# Patient Record
Sex: Female | Born: 1955 | Race: Black or African American | Hispanic: No | Marital: Single | Smoking: Never smoker
Health system: Southern US, Community
[De-identification: ages and names within clinical notes are randomized; demographics above are authoritative.]

## PROBLEM LIST (undated history)

## (undated) DIAGNOSIS — G4730 Sleep apnea, unspecified: Secondary | ICD-10-CM

## (undated) DIAGNOSIS — R0602 Shortness of breath: Secondary | ICD-10-CM

## (undated) DIAGNOSIS — I509 Heart failure, unspecified: Secondary | ICD-10-CM

## (undated) DIAGNOSIS — M1990 Unspecified osteoarthritis, unspecified site: Secondary | ICD-10-CM

## (undated) HISTORY — DX: Sleep apnea, unspecified: G47.30

## (undated) HISTORY — DX: Unspecified osteoarthritis, unspecified site: M19.90

## (undated) HISTORY — DX: Heart failure, unspecified: I50.9

## (undated) HISTORY — DX: Shortness of breath: R06.02

---
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
## 2009-01-13 DIAGNOSIS — I509 Heart failure, unspecified: Secondary | ICD-10-CM

## 2009-01-13 DIAGNOSIS — G4730 Sleep apnea, unspecified: Secondary | ICD-10-CM

## 2009-01-13 HISTORY — DX: Sleep apnea, unspecified: G47.30

## 2009-01-13 HISTORY — DX: Heart failure, unspecified: I50.9

## 2012-12-23 ENCOUNTER — Ambulatory Visit: Payer: Self-pay | Admitting: Geriatric Medicine

## 2012-12-31 ENCOUNTER — Ambulatory Visit: Payer: Self-pay | Admitting: Geriatric Medicine

## 2012-12-31 HISTORY — PX: BREAST BIOPSY: SHX20

## 2013-01-11 ENCOUNTER — Encounter: Payer: Self-pay | Admitting: General Surgery

## 2013-01-12 ENCOUNTER — Ambulatory Visit: Payer: Self-pay | Admitting: Oncology

## 2013-01-12 ENCOUNTER — Encounter: Payer: Self-pay | Admitting: General Surgery

## 2013-01-17 LAB — PATHOLOGY REPORT

## 2013-01-18 ENCOUNTER — Ambulatory Visit (INDEPENDENT_AMBULATORY_CARE_PROVIDER_SITE_OTHER): Payer: Medicaid Other | Admitting: General Surgery

## 2013-01-18 ENCOUNTER — Encounter: Payer: Self-pay | Admitting: General Surgery

## 2013-01-18 ENCOUNTER — Ambulatory Visit: Payer: Self-pay | Admitting: Oncology

## 2013-01-18 VITALS — BP 112/74 | HR 88 | Resp 16 | Ht 68.0 in | Wt >= 6400 oz

## 2013-01-18 DIAGNOSIS — C50919 Malignant neoplasm of unspecified site of unspecified female breast: Secondary | ICD-10-CM

## 2013-01-18 DIAGNOSIS — Z8679 Personal history of other diseases of the circulatory system: Secondary | ICD-10-CM

## 2013-01-18 DIAGNOSIS — C50912 Malignant neoplasm of unspecified site of left female breast: Secondary | ICD-10-CM

## 2013-01-18 NOTE — Patient Instructions (Addendum)

## 2013-01-18 NOTE — Progress Notes (Signed)
Patient ID: Amanda Shah, female   DOB: 1955-02-15, 58 y.o.   MRN: [id number]  Chief Complaint  Patient presents with  . Breast Problem    breast cancer    HPI Amanda Shah is a 58 y.o. female  Here for follow up mammogram and subsequent left breast biopsy both done at [facility]. She first noticed that mass in the left breast first part of December.  Prior mammogram was 3-4 years ago in [place]. She is here today with her niece.  State she does receive home health. States she did see Dr Massie Maroon this morning and she needs chemotherapy to try to reduce the tumor, he recommends a port placement.   By report, the patient was hospitalized for 18 days with congestive heart failure at [facility] about 3 years ago. She's been followed with home monitoring since that time. She has long-standing lower extremity edema, dyspnea on exertion and reportedly has been evaluated for a small thyroid nodule.  At the time of initial presentation no outside records were available for review.  The patient was accompanied by a niece, Amanda Shah, who was able to relate the context of the evaluation with medical oncology completed earlier in the day.  HPI  Past Medical History  Diagnosis Date  . Congestive heart failure 2011    [facility]  . Sleep apnea 2011  . Arthritis   . Shortness of breath     Past Surgical History  Procedure Laterality Date  . Hernia repair  1965  . Breast biopsy Left 12-31-12    No family history on file.  Social History History  Substance Use Topics  . Smoking status: Never Smoker   . Smokeless tobacco: Never Used  . Alcohol Use: Yes    No Known Allergies  Current Outpatient Prescriptions  Medication Sig Dispense Refill  . aspirin 81 MG tablet Take 81 mg by mouth daily.      . carvedilol (COREG) 6.25 MG tablet Take 6.25 mg by mouth 2 (two) times daily with a meal.      . furosemide (LASIX) 80 MG tablet Take 80 mg by mouth 2 (two) times daily.      Marland Kitchen lisinopril  (PRINIVIL,ZESTRIL) 10 MG tablet Take 10 mg by mouth daily.      Marland Kitchen loratadine (CLARITIN) 10 MG tablet Take 10 mg by mouth daily.      . Magnesium Oxide 200 MG TABS Take 2 tablets by mouth 2 (two) times daily.      . OXYCODONE-ACETAMINOPHEN PO Take 1 tablet by mouth every 6 (six) hours as needed.      . potassium citrate (UROCIT-K) 10 MEQ (1080 MG) SR tablet Take 40 mEq by mouth daily.      . promethazine (PHENERGAN) 25 MG tablet Take 25 mg by mouth every 6 (six) hours as needed for nausea or vomiting.      Marland Kitchen spironolactone (ALDACTONE) 50 MG tablet Take 50 mg by mouth daily.      . traMADol (ULTRAM) 50 MG tablet Take by mouth every 12 (twelve) hours as needed.      . warfarin (COUMADIN) 4 MG tablet Take 8 mg by mouth daily. Monday and friday      . warfarin (COUMADIN) 6 MG tablet Take 7 mg by mouth daily. tue wed and thurs       No current facility-administered medications for this visit.    Review of Systems Review of Systems  Constitutional: Negative.   Respiratory: Positive for shortness of breath.  Cardiovascular: Negative.     Blood pressure 112/74, pulse 88, resp. rate 16, height _0  (1.727 m), weight 407 lb (184.614 kg), SpO2 95.00%.  Physical Exam Physical Exam  Constitutional: She is oriented to person, place, and time. She appears well-developed and well-nourished.  Eyes: No scleral icterus.  Neck: Neck supple.  Cardiovascular: Normal rate, regular rhythm and normal heart sounds.   Pulmonary/Chest: Effort normal and breath sounds normal. Right breast exhibits no inverted nipple, no mass, no nipple discharge, no skin change and no tenderness. Left breast exhibits mass. Left breast exhibits no inverted nipple, no nipple discharge, no skin change and no tenderness.  3 cm fullness at 2 o'clock 15 CFN left breast Thickening left breast  Thickening in skin left left axilla  Lymphadenopathy:    She has no cervical adenopathy.    She has no axillary adenopathy.  Neurological:  She is alert and oriented to person, place, and time.  Skin: Skin is warm and dry.    Data Reviewed Ultrasound guided biopsy of a 1.3 cm mass in the 2:00 position of the left breast showed invasive mammary carcinoma with areas of necrosis.  14-gauge biopsy of what was reported being abnormal left axillary lymph node showed invasive mammary carcinoma without evidence of lymph node architecture being identified. This was an ER/PR positive tumor with HER-2/neu amplification.  Assessment    Advanced left breast cancer.  Morbid obesity.  History congestive heart failure.    Plan    The patient and her family were encouraged to be in contact with her cardiologist at [facility] to keep them of breast of the plans for treatment of her breast cancer.  Central venous access has been requested by the medical oncology service. This may be difficult based on the patient's body habitus, putting her at increased risk for complications (venous/pulmonary) sutures.  By report, the patient has not experienced lower extremity or pulmonary emboli, nor is she have a stent in place necessitating ongoing anticoagulation during the procedure. She is, again by report, on Coumadin because of a very low ejection fraction, 15%.    Patient's surgery has been scheduled for 02-04-13 at [facility]. This patient has been asked to stop coumadin 5 days prior to procedure.    Robert Bellow 01/19/2013, 7:17 AM

## 2013-01-19 ENCOUNTER — Other Ambulatory Visit: Payer: Self-pay | Admitting: General Surgery

## 2013-01-19 DIAGNOSIS — C50912 Malignant neoplasm of unspecified site of left female breast: Secondary | ICD-10-CM

## 2013-01-19 DIAGNOSIS — Z8679 Personal history of other diseases of the circulatory system: Secondary | ICD-10-CM | POA: Insufficient documentation

## 2013-01-19 DIAGNOSIS — C50919 Malignant neoplasm of unspecified site of unspecified female breast: Secondary | ICD-10-CM | POA: Insufficient documentation

## 2013-01-20 ENCOUNTER — Other Ambulatory Visit: Payer: Self-pay | Admitting: General Surgery

## 2013-01-20 DIAGNOSIS — C50912 Malignant neoplasm of unspecified site of left female breast: Secondary | ICD-10-CM

## 2013-01-25 ENCOUNTER — Ambulatory Visit: Payer: Self-pay | Admitting: General Surgery

## 2013-01-25 LAB — BASIC METABOLIC PANEL
Anion Gap: 3 — ABNORMAL LOW (ref 7–16)
BUN: 18 mg/dL (ref 7–18)
Calcium, Total: 9.7 mg/dL (ref 8.5–10.1)
Chloride: 98 mmol/L (ref 98–107)
Co2: 34 mmol/L — ABNORMAL HIGH (ref 21–32)
Creatinine: 0.69 mg/dL (ref 0.60–1.30)
EGFR (African American): >60
Glucose: 79 mg/dL (ref 65–99)
OSMOLALITY: 271 (ref 275–301)
Potassium: 4.4 mmol/L (ref 3.5–5.1)
Sodium: 135 mmol/L — ABNORMAL LOW (ref 136–145)

## 2013-01-25 LAB — CBC WITH DIFFERENTIAL/PLATELET
BASOS PCT: 1.1 %
Basophil #: 0.1 10*3/uL (ref 0.0–0.1)
Eosinophil #: 0.2 10*3/uL (ref 0.0–0.7)
Eosinophil %: 2.4 %
HCT: 41.9 % (ref 35.0–47.0)
HGB: 13.5 g/dL (ref 12.0–16.0)
LYMPHS ABS: 1.6 10*3/uL (ref 1.0–3.6)
LYMPHS PCT: 24.2 %
MCH: 30.5 pg (ref 26.0–34.0)
MCHC: 32.2 g/dL (ref 32.0–36.0)
MCV: 95 fL (ref 80–100)
MONO ABS: 0.9 x10 3/mm (ref 0.2–0.9)
MONOS PCT: 13 %
Neutrophil #: 3.9 10*3/uL (ref 1.4–6.5)
Neutrophil %: 59.3 %
Platelet: 210 10*3/uL (ref 150–440)
RBC: 4.43 10*6/uL (ref 3.80–5.20)
RDW: 15.9 % — AB (ref 11.5–14.5)
WBC: 6.6 10*3/uL (ref 3.6–11.0)

## 2013-02-04 ENCOUNTER — Ambulatory Visit: Payer: Self-pay | Admitting: General Surgery

## 2013-02-04 DIAGNOSIS — C50919 Malignant neoplasm of unspecified site of unspecified female breast: Secondary | ICD-10-CM

## 2013-02-04 LAB — PROTIME-INR
INR: 1.2
Prothrombin Time: 14.8 secs — ABNORMAL HIGH (ref 11.5–14.7)

## 2013-02-08 ENCOUNTER — Encounter: Payer: Self-pay | Admitting: General Surgery

## 2013-02-13 ENCOUNTER — Ambulatory Visit: Payer: Self-pay | Admitting: Oncology

## 2013-02-25 LAB — COMPREHENSIVE METABOLIC PANEL
ALK PHOS: 104 U/L
ALT: 20 U/L (ref 12–78)
Albumin: 3.3 g/dL — ABNORMAL LOW (ref 3.4–5.0)
Anion Gap: 9 (ref 7–16)
BUN: 20 mg/dL — AB (ref 7–18)
Bilirubin,Total: 0.3 mg/dL (ref 0.2–1.0)
CALCIUM: 8.8 mg/dL (ref 8.5–10.1)
CREATININE: 0.98 mg/dL (ref 0.60–1.30)
Chloride: 98 mmol/L (ref 98–107)
Co2: 31 mmol/L (ref 21–32)
EGFR (Non-African Amer.): >60
GLUCOSE: 110 mg/dL — AB (ref 65–99)
OSMOLALITY: 279 (ref 275–301)
POTASSIUM: 4 mmol/L (ref 3.5–5.1)
SGOT(AST): 17 U/L (ref 15–37)
SODIUM: 138 mmol/L (ref 136–145)
TOTAL PROTEIN: 8 g/dL (ref 6.4–8.2)

## 2013-02-25 LAB — CBC CANCER CENTER
Basophil #: 0 x10 3/mm (ref 0.0–0.1)
Basophil %: 0.6 %
Eosinophil #: 0.2 x10 3/mm (ref 0.0–0.7)
Eosinophil %: 2.9 %
HCT: 41.4 % (ref 35.0–47.0)
HGB: 13.3 g/dL (ref 12.0–16.0)
LYMPHS PCT: 23.8 %
Lymphocyte #: 1.3 x10 3/mm (ref 1.0–3.6)
MCH: 30.6 pg (ref 26.0–34.0)
MCHC: 32.2 g/dL (ref 32.0–36.0)
MCV: 95 fL (ref 80–100)
MONO ABS: 0.8 x10 3/mm (ref 0.2–0.9)
Monocyte %: 14.6 %
Neutrophil #: 3.1 x10 3/mm (ref 1.4–6.5)
Neutrophil %: 58.1 %
Platelet: 210 x10 3/mm (ref 150–440)
RBC: 4.35 10*6/uL (ref 3.80–5.20)
RDW: 16 % — ABNORMAL HIGH (ref 11.5–14.5)
WBC: 5.3 x10 3/mm (ref 3.6–11.0)

## 2013-02-28 LAB — CANCER ANTIGEN 27.29: CA 27.29: 59.5 U/mL — ABNORMAL HIGH (ref 0.0–38.6)

## 2013-03-13 ENCOUNTER — Ambulatory Visit: Payer: Self-pay | Admitting: Oncology

## 2013-03-18 LAB — COMPREHENSIVE METABOLIC PANEL
ALK PHOS: 117 U/L
Albumin: 3.4 g/dL (ref 3.4–5.0)
Anion Gap: 6 — ABNORMAL LOW (ref 7–16)
BUN: 26 mg/dL — ABNORMAL HIGH (ref 7–18)
Bilirubin,Total: 0.2 mg/dL (ref 0.2–1.0)
Calcium, Total: 9.4 mg/dL (ref 8.5–10.1)
Chloride: 94 mmol/L — ABNORMAL LOW (ref 98–107)
Co2: 33 mmol/L — ABNORMAL HIGH (ref 21–32)
Creatinine: 0.91 mg/dL (ref 0.60–1.30)
GLUCOSE: 106 mg/dL — AB (ref 65–99)
Osmolality: 272 (ref 275–301)
POTASSIUM: 4.5 mmol/L (ref 3.5–5.1)
SGOT(AST): 19 U/L (ref 15–37)
SGPT (ALT): 24 U/L (ref 12–78)
Sodium: 133 mmol/L — ABNORMAL LOW (ref 136–145)
Total Protein: 8 g/dL (ref 6.4–8.2)

## 2013-03-18 LAB — CBC CANCER CENTER
Basophil #: 0 x10 3/mm (ref 0.0–0.1)
Basophil %: 0.4 %
EOS ABS: 0.1 x10 3/mm (ref 0.0–0.7)
Eosinophil %: 0.7 %
HCT: 42.9 % (ref 35.0–47.0)
HGB: 13.8 g/dL (ref 12.0–16.0)
LYMPHS ABS: 1.6 x10 3/mm (ref 1.0–3.6)
Lymphocyte %: 22.2 %
MCH: 30.2 pg (ref 26.0–34.0)
MCHC: 32 g/dL (ref 32.0–36.0)
MCV: 94 fL (ref 80–100)
Monocyte #: 1.2 x10 3/mm — ABNORMAL HIGH (ref 0.2–0.9)
Monocyte %: 16.5 %
Neutrophil #: 4.4 x10 3/mm (ref 1.4–6.5)
Neutrophil %: 60.2 %
PLATELETS: 203 x10 3/mm (ref 150–440)
RBC: 4.56 10*6/uL (ref 3.80–5.20)
RDW: 15.7 % — ABNORMAL HIGH (ref 11.5–14.5)
WBC: 7.3 x10 3/mm (ref 3.6–11.0)

## 2013-04-08 LAB — COMPREHENSIVE METABOLIC PANEL
Albumin: 3.4 g/dL (ref 3.4–5.0)
Alkaline Phosphatase: 114 U/L
Anion Gap: 7 (ref 7–16)
BUN: 20 mg/dL — AB (ref 7–18)
Bilirubin,Total: 0.4 mg/dL (ref 0.2–1.0)
CHLORIDE: 96 mmol/L — AB (ref 98–107)
Calcium, Total: 9.4 mg/dL (ref 8.5–10.1)
Co2: 33 mmol/L — ABNORMAL HIGH (ref 21–32)
Creatinine: 0.88 mg/dL (ref 0.60–1.30)
EGFR (African American): >60
EGFR (Non-African Amer.): >60
Glucose: 106 mg/dL — ABNORMAL HIGH (ref 65–99)
Osmolality: 275 (ref 275–301)
Potassium: 4.3 mmol/L (ref 3.5–5.1)
SGOT(AST): 17 U/L (ref 15–37)
SGPT (ALT): 25 U/L (ref 12–78)
SODIUM: 136 mmol/L (ref 136–145)
TOTAL PROTEIN: 7.7 g/dL (ref 6.4–8.2)

## 2013-04-08 LAB — CBC CANCER CENTER
BASOS PCT: 0.3 %
Basophil #: 0 x10 3/mm (ref 0.0–0.1)
EOS PCT: 0.5 %
Eosinophil #: 0 x10 3/mm (ref 0.0–0.7)
HCT: 40.4 % (ref 35.0–47.0)
HGB: 12.9 g/dL (ref 12.0–16.0)
LYMPHS PCT: 26.6 %
Lymphocyte #: 1.3 x10 3/mm (ref 1.0–3.6)
MCH: 30.1 pg (ref 26.0–34.0)
MCHC: 31.9 g/dL — AB (ref 32.0–36.0)
MCV: 94 fL (ref 80–100)
Monocyte #: 0.9 x10 3/mm (ref 0.2–0.9)
Monocyte %: 19.1 %
NEUTROS ABS: 2.6 x10 3/mm (ref 1.4–6.5)
Neutrophil %: 53.5 %
Platelet: 158 x10 3/mm (ref 150–440)
RBC: 4.28 10*6/uL (ref 3.80–5.20)
RDW: 16.8 % — ABNORMAL HIGH (ref 11.5–14.5)
WBC: 4.9 x10 3/mm (ref 3.6–11.0)

## 2013-04-13 ENCOUNTER — Ambulatory Visit: Payer: Self-pay | Admitting: Oncology

## 2013-04-14 ENCOUNTER — Encounter: Payer: Self-pay | Admitting: General Surgery

## 2013-04-14 ENCOUNTER — Other Ambulatory Visit: Payer: Medicaid Other

## 2013-04-14 ENCOUNTER — Ambulatory Visit (INDEPENDENT_AMBULATORY_CARE_PROVIDER_SITE_OTHER): Payer: Medicaid Other | Admitting: General Surgery

## 2013-04-14 VITALS — BP 110/70 | HR 88 | Resp 16 | Ht 68.0 in | Wt 389.0 lb

## 2013-04-14 DIAGNOSIS — C50912 Malignant neoplasm of unspecified site of left female breast: Secondary | ICD-10-CM

## 2013-04-14 DIAGNOSIS — C50919 Malignant neoplasm of unspecified site of unspecified female breast: Secondary | ICD-10-CM

## 2013-04-14 NOTE — Patient Instructions (Signed)
The patient is aware to call back for any questions or concerns.  

## 2013-04-14 NOTE — Progress Notes (Signed)
Patient ID: Amanda Shah, female   DOB: 02-15-1955, 58 y.o.   MRN: [id number]  Chief Complaint  Patient presents with  . Follow-up     breast    HPI Amanda Shah is a 58 y.o. female here today following up 3 round of chemo. The patient denies any new problems with the breasts at this time.  The patient's power port has been functioning adequately. This was placed in a somewhat atypical location do her body habitus and the need to complete a cutdown to achieve venous access.  The patient continues to report heaviness and swelling in the left breast.  HPI  Past Medical History  Diagnosis Date  . Congestive heart failure 2011    [facility]  . Sleep apnea 2011  . Arthritis   . Shortness of breath     Past Surgical History  Procedure Laterality Date  . Hernia repair  1965  . Breast biopsy Left 12-31-12    History reviewed. No pertinent family history.  Social History History  Substance Use Topics  . Smoking status: Never Smoker   . Smokeless tobacco: Never Used  . Alcohol Use: Yes    No Known Allergies  Current Outpatient Prescriptions  Medication Sig Dispense Refill  . aspirin 81 MG tablet Take 81 mg by mouth daily.      . carvedilol (COREG) 6.25 MG tablet Take 6.25 mg by mouth 2 (two) times daily with a meal.      . chlorhexidine (PERIDEX) 0.12 % solution       . furosemide (LASIX) 80 MG tablet Take 80 mg by mouth 2 (two) times daily.      Marland Kitchen lisinopril (PRINIVIL,ZESTRIL) 10 MG tablet Take 10 mg by mouth daily.      Marland Kitchen loratadine (CLARITIN) 10 MG tablet Take 10 mg by mouth daily.      . Magnesium Oxide 200 MG TABS Take 2 tablets by mouth 2 (two) times daily.      . OXYCODONE-ACETAMINOPHEN PO Take 1 tablet by mouth every 6 (six) hours as needed.      . pegfilgrastim (NEULASTA) 6 MG/0.6ML injection Inject 6 mg into the skin every 21 ( twenty-one) days.      . potassium citrate (UROCIT-K) 10 MEQ (1080 MG) SR tablet Take 40 mEq by mouth daily.      Marland Kitchen PRESCRIPTION MEDICATION  Medicated Mouthwash. 1-2 teaspoonful orally 4 times daily.      . promethazine (PHENERGAN) 25 MG tablet Take 25 mg by mouth every 6 (six) hours as needed for nausea or vomiting.      Marland Kitchen spironolactone (ALDACTONE) 50 MG tablet Take 50 mg by mouth daily.      . traMADol (ULTRAM) 50 MG tablet Take by mouth every 12 (twelve) hours as needed.      . warfarin (COUMADIN) 4 MG tablet Take 8 mg by mouth daily. Monday and friday      . warfarin (COUMADIN) 6 MG tablet Take 7 mg by mouth daily. tue wed and thurs       No current facility-administered medications for this visit.    Review of Systems Review of Systems  Constitutional: Negative.   Respiratory: Negative.   Cardiovascular: Negative.     Blood pressure 110/70, pulse 88, resp. rate 16, height 5\' 8"  (1.727 m), weight 389 lb (176.449 kg).  Physical Exam Physical Exam  Pulmonary/Chest: Left breast exhibits skin change (Diffuse changes of peua d'orange noted throughout the left breast).      Data Reviewed  Ultrasound examination of the left breast showed multiple dilated veins up to 0.47 cm. In the right axilla a decreased size lymph node is identified, 1 cm in diameter. Loss of normal echo architecture. In the 2 o'clock position of the left breast the primary tumor appears to be 1.22 cm in diameter.  Assessment    Advanced left breast cancer, changes consistent with inflammatory carcinoma.     Plan    The patient will continue her neoadjuvant chemotherapy. A likelihood that she will require mastectomy was discussed. She is at high surgical risk due to her multiple medical comorbidities. She does report that she has been in touch with the cardiology service at [facility].     PCP: Dorna Mai MD   Robert Bellow 04/16/2013, 8:41 AM

## 2013-04-16 DIAGNOSIS — C50912 Malignant neoplasm of unspecified site of left female breast: Secondary | ICD-10-CM | POA: Insufficient documentation

## 2013-04-29 LAB — CBC CANCER CENTER
BASOS ABS: 0 x10 3/mm (ref 0.0–0.1)
Basophil %: 0.2 %
EOS ABS: 0 x10 3/mm (ref 0.0–0.7)
Eosinophil %: 0.1 %
HCT: 39.9 % (ref 35.0–47.0)
HGB: 12.8 g/dL (ref 12.0–16.0)
LYMPHS ABS: 1.5 x10 3/mm (ref 1.0–3.6)
Lymphocyte %: 19.2 %
MCH: 31 pg (ref 26.0–34.0)
MCHC: 32.1 g/dL (ref 32.0–36.0)
MCV: 97 fL (ref 80–100)
Monocyte #: 1.2 x10 3/mm — ABNORMAL HIGH (ref 0.2–0.9)
Monocyte %: 16.5 %
NEUTROS ABS: 4.8 x10 3/mm (ref 1.4–6.5)
Neutrophil %: 64 %
Platelet: 168 x10 3/mm (ref 150–440)
RBC: 4.13 10*6/uL (ref 3.80–5.20)
RDW: 19.3 % — ABNORMAL HIGH (ref 11.5–14.5)
WBC: 7.6 x10 3/mm (ref 3.6–11.0)

## 2013-04-29 LAB — COMPREHENSIVE METABOLIC PANEL
ALBUMIN: 3.4 g/dL (ref 3.4–5.0)
ALT: 23 U/L (ref 12–78)
AST: 14 U/L — AB (ref 15–37)
Alkaline Phosphatase: 101 U/L
Anion Gap: 8 (ref 7–16)
BUN: 22 mg/dL — ABNORMAL HIGH (ref 7–18)
Bilirubin,Total: 0.4 mg/dL (ref 0.2–1.0)
CALCIUM: 9.9 mg/dL (ref 8.5–10.1)
CREATININE: 0.87 mg/dL (ref 0.60–1.30)
Chloride: 95 mmol/L — ABNORMAL LOW (ref 98–107)
Co2: 33 mmol/L — ABNORMAL HIGH (ref 21–32)
EGFR (African American): >60
Glucose: 97 mg/dL (ref 65–99)
OSMOLALITY: 275 (ref 275–301)
Potassium: 4.4 mmol/L (ref 3.5–5.1)
Sodium: 136 mmol/L (ref 136–145)
TOTAL PROTEIN: 7.8 g/dL (ref 6.4–8.2)

## 2013-05-13 ENCOUNTER — Ambulatory Visit: Payer: Self-pay | Admitting: Oncology

## 2013-05-20 LAB — CBC CANCER CENTER
BASOS PCT: 0.7 %
Basophil #: 0 x10 3/mm (ref 0.0–0.1)
Eosinophil #: 0 x10 3/mm (ref 0.0–0.7)
Eosinophil %: 0.1 %
HCT: 37.5 % (ref 35.0–47.0)
HGB: 12.2 g/dL (ref 12.0–16.0)
Lymphocyte #: 1.3 x10 3/mm (ref 1.0–3.6)
Lymphocyte %: 23.4 %
MCH: 32.6 pg (ref 26.0–34.0)
MCHC: 32.4 g/dL (ref 32.0–36.0)
MCV: 101 fL — AB (ref 80–100)
MONOS PCT: 17.1 %
Monocyte #: 0.9 x10 3/mm (ref 0.2–0.9)
Neutrophil #: 3.2 x10 3/mm (ref 1.4–6.5)
Neutrophil %: 58.7 %
PLATELETS: 136 x10 3/mm — AB (ref 150–440)
RBC: 3.73 10*6/uL — ABNORMAL LOW (ref 3.80–5.20)
RDW: 21.9 % — ABNORMAL HIGH (ref 11.5–14.5)
WBC: 5.5 x10 3/mm (ref 3.6–11.0)

## 2013-05-20 LAB — COMPREHENSIVE METABOLIC PANEL
ANION GAP: 7 (ref 7–16)
AST: 15 U/L (ref 15–37)
Albumin: 3.2 g/dL — ABNORMAL LOW (ref 3.4–5.0)
Alkaline Phosphatase: 85 U/L
BUN: 16 mg/dL (ref 7–18)
Bilirubin,Total: 0.5 mg/dL (ref 0.2–1.0)
CALCIUM: 8.9 mg/dL (ref 8.5–10.1)
CREATININE: 0.74 mg/dL (ref 0.60–1.30)
Chloride: 95 mmol/L — ABNORMAL LOW (ref 98–107)
Co2: 33 mmol/L — ABNORMAL HIGH (ref 21–32)
EGFR (African American): >60
EGFR (Non-African Amer.): >60
GLUCOSE: 94 mg/dL (ref 65–99)
OSMOLALITY: 271 (ref 275–301)
POTASSIUM: 4.6 mmol/L (ref 3.5–5.1)
SGPT (ALT): 25 U/L (ref 12–78)
Sodium: 135 mmol/L — ABNORMAL LOW (ref 136–145)
Total Protein: 7.2 g/dL (ref 6.4–8.2)

## 2013-06-10 ENCOUNTER — Telehealth: Payer: Self-pay | Admitting: General Surgery

## 2013-06-10 LAB — CBC CANCER CENTER
BASOS ABS: 0 x10 3/mm (ref 0.0–0.1)
BASOS PCT: 0.2 %
Eosinophil #: 0 x10 3/mm (ref 0.0–0.7)
Eosinophil %: 0.1 %
HCT: 36.1 % (ref 35.0–47.0)
HGB: 11.8 g/dL — ABNORMAL LOW (ref 12.0–16.0)
Lymphocyte #: 1.2 x10 3/mm (ref 1.0–3.6)
Lymphocyte %: 27.6 %
MCH: 34.7 pg — AB (ref 26.0–34.0)
MCHC: 32.7 g/dL (ref 32.0–36.0)
MCV: 106 fL — ABNORMAL HIGH (ref 80–100)
MONO ABS: 1 x10 3/mm — AB (ref 0.2–0.9)
Monocyte %: 21.7 %
NEUTROS ABS: 2.2 x10 3/mm (ref 1.4–6.5)
Neutrophil %: 50.4 %
PLATELETS: 129 x10 3/mm — AB (ref 150–440)
RBC: 3.4 10*6/uL — AB (ref 3.80–5.20)
RDW: 22.8 % — AB (ref 11.5–14.5)
WBC: 4.4 x10 3/mm (ref 3.6–11.0)

## 2013-06-10 LAB — COMPREHENSIVE METABOLIC PANEL
Albumin: 3 g/dL — ABNORMAL LOW (ref 3.4–5.0)
Alkaline Phosphatase: 87 U/L
Anion Gap: 6 — ABNORMAL LOW (ref 7–16)
BILIRUBIN TOTAL: 0.5 mg/dL (ref 0.2–1.0)
BUN: 18 mg/dL (ref 7–18)
CO2: 33 mmol/L — AB (ref 21–32)
Calcium, Total: 8.7 mg/dL (ref 8.5–10.1)
Chloride: 96 mmol/L — ABNORMAL LOW (ref 98–107)
Creatinine: 0.8 mg/dL (ref 0.60–1.30)
EGFR (African American): >60
EGFR (Non-African Amer.): >60
GLUCOSE: 99 mg/dL (ref 65–99)
Osmolality: 272 (ref 275–301)
Potassium: 4.9 mmol/L (ref 3.5–5.1)
SGOT(AST): 17 U/L (ref 15–37)
SGPT (ALT): 23 U/L (ref 12–78)
Sodium: 135 mmol/L — ABNORMAL LOW (ref 136–145)
TOTAL PROTEIN: 6.9 g/dL (ref 6.4–8.2)

## 2013-06-10 NOTE — Telephone Encounter (Signed)
CHEVONYA EALEY (PTS NIECE) CALLED AND STATED PTS CHEMO IS COMPLETED & WOULD LIKE TO DISCUSS SURGERY OPTIONS.CURRENTLY I HAVE THEM [facility] FOR 06-22-13 @ 4:30.(AWARE THEY WILL BE SEEN LAST).HOWEVER CHEVONYA WAS HOPING YOU WOULD CALL THEM TO DO THE DISCUSSION AND THEY COULD [place] SPEAKER PHONE.(CALL EARLY AM OR LATE PM)SHE'S OFF ON FRIDAYS.IF YOU WOULD LIKE TO CALL HER, LET us KNOW SO SHE WILL BE AVAILBLE BY PHONE.([phone])

## 2013-06-13 ENCOUNTER — Ambulatory Visit: Payer: Self-pay | Admitting: Oncology

## 2013-06-13 NOTE — Telephone Encounter (Signed)
This needs to be an in person visit.  We can attempt to accommodate their schedule by having them come in on Friday, June 5th at 11 AM. If this does not work, we will need to keep the appt on June 10.

## 2013-06-17 ENCOUNTER — Ambulatory Visit: Payer: Medicaid Other | Admitting: General Surgery

## 2013-06-22 ENCOUNTER — Ambulatory Visit: Payer: Medicaid Other | Admitting: General Surgery

## 2013-07-06 ENCOUNTER — Ambulatory Visit (INDEPENDENT_AMBULATORY_CARE_PROVIDER_SITE_OTHER): Payer: Medicaid Other | Admitting: General Surgery

## 2013-07-06 ENCOUNTER — Telehealth: Payer: Self-pay | Admitting: *Deleted

## 2013-07-06 ENCOUNTER — Encounter: Payer: Self-pay | Admitting: General Surgery

## 2013-07-06 VITALS — BP 98/62 | HR 92 | Resp 16 | Ht 68.0 in | Wt >= 6400 oz

## 2013-07-06 DIAGNOSIS — I890 Lymphedema, not elsewhere classified: Secondary | ICD-10-CM

## 2013-07-06 DIAGNOSIS — Z8679 Personal history of other diseases of the circulatory system: Secondary | ICD-10-CM

## 2013-07-06 DIAGNOSIS — C50919 Malignant neoplasm of unspecified site of unspecified female breast: Secondary | ICD-10-CM

## 2013-07-06 DIAGNOSIS — C50912 Malignant neoplasm of unspecified site of left female breast: Secondary | ICD-10-CM

## 2013-07-06 NOTE — Telephone Encounter (Signed)
Patient called back to state that she cannot keep appointment for left upper extremity ultrasound for tomorrow, 07-07-13, due to transportation. She wishes to check with her niece before she reschedules appointment.   Pamala Hurry at the [facility] Scheduling Department has been notified accordingly.

## 2013-07-06 NOTE — Progress Notes (Signed)
Patient ID: Amanda Shah, female   DOB: [birth year], 58 y.o.   MRN: [id number]  Chief Complaint  Patient presents with  . Follow-up    discuss surgery options, chemotherapy completed    HPI Amanda Shah is a 58 y.o. female.  Here today for follow up breast cancer to discuss surgery options, chemotherapy completed 06/10/13. The sixth planned chemotherapy treatment was withheld because of her medical condition. She reports that she developed the sudden onset of left upper extremity swelling prior to presentation for her last medical oncology followup. There's been no progression since onset, but she is uncomfortable due to the massive swelling of her left upper extremity.  She feels that her respiratory status is worse than it was prior to initiation of chemotherapy. She is accompanied today by one of her nieces.  HPI  Past Medical History  Diagnosis Date  . Congestive heart failure 2011    [facility]  . Sleep apnea 2011  . Arthritis   . Shortness of breath     Past Surgical History  Procedure Laterality Date  . Hernia repair  1965  . Breast biopsy Left 12-31-12    No family history on file.  Social History History  Substance Use Topics  . Smoking status: Never Smoker   . Smokeless tobacco: Never Used  . Alcohol Use: Yes    No Known Allergies  Current Outpatient Prescriptions  Medication Sig Dispense Refill  . aspirin 81 MG tablet Take 81 mg by mouth daily.      . carvedilol (COREG) 6.25 MG tablet Take 6.25 mg by mouth 2 (two) times daily with a meal.      . chlorhexidine (PERIDEX) 0.12 % solution       . furosemide (LASIX) 80 MG tablet Take 80 mg by mouth 2 (two) times daily.      Marland Kitchen lisinopril (PRINIVIL,ZESTRIL) 10 MG tablet Take 10 mg by mouth daily.      Marland Kitchen loratadine (CLARITIN) 10 MG tablet Take 10 mg by mouth daily.      . Magnesium Oxide 200 MG TABS Take 2 tablets by mouth 2 (two) times daily.      . OXYCODONE-ACETAMINOPHEN PO Take 1 tablet by mouth every 6 (six) hours  as needed.      . pegfilgrastim (NEULASTA) 6 MG/0.6ML injection Inject 6 mg into the skin every 21 ( twenty-one) days.      . potassium citrate (UROCIT-K) 10 MEQ (1080 MG) SR tablet Take 40 mEq by mouth daily.      Marland Kitchen PRESCRIPTION MEDICATION Medicated Mouthwash. 1-2 teaspoonful orally 4 times daily.      . promethazine (PHENERGAN) 25 MG tablet Take 25 mg by mouth every 6 (six) hours as needed for nausea or vomiting.      Marland Kitchen spironolactone (ALDACTONE) 50 MG tablet Take 50 mg by mouth daily.      . traMADol (ULTRAM) 50 MG tablet Take by mouth every 12 (twelve) hours as needed.      . warfarin (COUMADIN) 4 MG tablet Take 8 mg by mouth daily. Monday and friday      . warfarin (COUMADIN) 6 MG tablet Take 7 mg by mouth daily. tue wed and thurs       No current facility-administered medications for this visit.    Review of Systems Review of Systems  Constitutional: Negative.   Respiratory: Positive for chest tightness and shortness of breath. Negative for apnea, cough, choking, wheezing and stridor.   Cardiovascular: Positive for leg swelling. Negative  for chest pain and palpitations.    Blood pressure 98/62, pulse 92, resp. rate 16, height 5\' 8"  (1.727 m), weight 431 lb (195.5 kg), SpO2 92.00%.  Physical Exam Physical Exam  Constitutional: She is oriented to person, place, and time. She appears well-developed and well-nourished.  Eyes: Conjunctivae are normal.  Cardiovascular: Normal rate, regular rhythm and normal heart sounds.   Pulmonary/Chest:    Left breast is heavily .   Musculoskeletal:       Arms: Lymphadenopathy:    She has no cervical adenopathy.    She has no axillary adenopathy.  Neurological: She is alert and oriented to person, place, and time.  Skin: Skin is warm and dry.  Left arm swollen     Data Reviewed Medical oncology notes of Jun 10, 2013 were reviewed. No edema was recorded.  Original cardiac assessment suggested a ejection fraction of 15%. MUGA scan  previously completed suggested an ejection fraction of 33% precluding the use of antracycline soar Herceptin. The possibility of breast conservation was recorded, put him on last examination with the patient and considering a clinical impression of inflammatory carcinoma this was not felt to be a viable option.  Laboratory studies dated Jun 10, 2013 showed white blood cell count 4400 with 50% polys. Hemoglobin 11.8 with MCV of 106. Mildly depressed platelet count 129,000. Normal blood sugar 99, creatinine 0.8, minimally depressed sodium and chloride. Normal potassium of 4.9. Albumin of 3.0. Estimated GFR greater than 60. Assessment    Inflammatory carcinoma left breast.  History of congestive heart failure with decreased cardiac function.  Marked left upper extremity swelling, sudden onset suggests more likely venous rather than lymphatic occlusion.     Plan    The importance of reassessment by her [facility] cardiologist was recommended.  Arrangements will be made for an ultrasound of the left upper extremity vasculature to assess whether she is indeed showing evidence of compression of the subclavian/axillary vein orifices all related to lymphadenopathy.  The patient is at high risk for surgical intervention due to her multiple medical comorbidities.     Patient has been scheduled for a left upper extremity ultrasound at [facility] for Thursday, 07-07-13 at 9:30 am (arrive 9:15 am). No prep. This patient is aware of date, time, and instructions. She verbalizes understanding.   Amanda Shah 07/06/2013, 9:49 PM

## 2013-07-06 NOTE — Patient Instructions (Signed)
Patient has been scheduled for a left upper extremity ultrasound at [facility] for Thursday, 07-07-13 at 9:30 am (arrive 9:15 am). No prep. This patient is aware of date, time, and instructions. She verbalizes understanding.

## 2013-07-08 ENCOUNTER — Telehealth: Payer: Self-pay

## 2013-07-08 NOTE — Telephone Encounter (Signed)
Patient called to reschedule her left arm ultrasound. Patient is scheduled for an ultrasound at [facility] on 07/11/13 at 11:00 am. She will arrive by 10:45 am, no preparation is needed. Patient is aware of date, time, and instructions.

## 2013-07-11 ENCOUNTER — Ambulatory Visit: Payer: Self-pay | Admitting: General Surgery

## 2013-07-11 ENCOUNTER — Encounter: Payer: Self-pay | Admitting: General Surgery

## 2013-07-12 ENCOUNTER — Telehealth: Payer: Self-pay | Admitting: *Deleted

## 2013-07-12 NOTE — Telephone Encounter (Signed)
Notified patient as instructed, patient pleased. She does not have an appointment with her [place] cardiologist as of yet. She is aware to let us know when that appointment has been scheduled and the importance of that appointment prior to surgery. She states she is weak and not sure she can even tolerate surgery or not. Aware breast surgery has not been scheduled as of yet.

## 2013-07-12 NOTE — Telephone Encounter (Signed)
Message copied by Carson Myrtle on Tue Jul 12, 2013  4:45 PM ------      Message from: [place], Forest Gleason      Created: Tue Jul 12, 2013  4:06 PM       Notify the patient the ultrasound did not show any blood clots in her left arm. Swelling likely secondary to breast cancer.      Confirm she has arranged for a cardiology f/u w/ her [facility] MD.        Needed before surgery.  ------

## 2013-07-14 ENCOUNTER — Telehealth: Payer: Self-pay | Admitting: *Deleted

## 2013-07-14 ENCOUNTER — Telehealth: Payer: Self-pay | Admitting: General Surgery

## 2013-07-14 NOTE — Telephone Encounter (Signed)
Patient has been scheduled for a PET scan at [facility] for 07-20-13 at 9:30 am (arrive 9:15 am). This patient is aware of all instructions and verbalizes understanding.

## 2013-07-14 NOTE — Telephone Encounter (Signed)
I spoke with Amanda Shah and she does understand that she needs to see her cardiologist before surgery and to let them know she has finished chemotherapy.  She has an appointment in August and she is going to call them to see if they can see her sooner because she is having increased ankle swelling and shortness of breath. She said an ultrasound was done and she did not have a clot in her legs. She will call us to let us know when that appt will be.

## 2013-07-14 NOTE — Telephone Encounter (Signed)
PTS NIECE [facility] NEEDED A CARDIOLOGY CONSULT.THE HAVE SURGICAL CLEARANCE FROM THEM ALREADY FROM ARPIL 2015.SHE IS GOING TO FAX THIS TO YOU TODAY.

## 2013-07-14 NOTE — Telephone Encounter (Signed)
Message copied by Dominga Ferry on Thu Jul 14, 2013  3:09 PM ------      Message from: [place], Marshall W      Created: Thu Jul 14, 2013  1:50 PM       Patient needs to be scheduled for a PET/CT to assess her breast cancer.   ------

## 2013-07-19 ENCOUNTER — Telehealth: Payer: Self-pay

## 2013-07-19 NOTE — Telephone Encounter (Signed)
Called and spoke to patient and let her that her PET scan was denied by her insurance. I let her know that is would need a physician review and that we would call her to reschedule the PET scan once authorization was obtained. Patient is aware that her PET scan has been cancelled for 07/20/13.

## 2013-08-11 ENCOUNTER — Telehealth: Payer: Self-pay

## 2013-08-11 NOTE — Telephone Encounter (Signed)
Spoke with patient's niece Angela Burke. She said that the patient was seen by cardiology at [facility] on 08/04/13. She is eager to schedule her surgery with Dr Bary Castilla. I told her we would need the clearance from the cardiologist prior to scheduling her surgery. I let her know I would contact [facility] and get the notes and clearance from the 08/04/13 visit and give them to Dr Bary Castilla so he can review them to determine how to proceed with surgical intervention. She said that she would call back tomorrow to make sure that we had received those records.

## 2013-08-15 ENCOUNTER — Telehealth: Payer: Self-pay | Admitting: *Deleted

## 2013-08-15 NOTE — Telephone Encounter (Signed)
Pt is finished with Chemo and wants to know what the next step is to do now.

## 2013-08-16 ENCOUNTER — Telehealth: Payer: Self-pay

## 2013-08-16 NOTE — Telephone Encounter (Signed)
Spoke with patient's niece, Angela Burke, about setting up a surgery and pre operative date. Patient is scheduled for surgery at [facility] on 09/02/13. She will be seen here in office for a Pre Op visit to go over surgery details with Dr Bary Castilla on 08/24/13 at 1:00 pm. She will pre admit at the hospital on 08/24/13 at 3:00 pm. I have mailed her the paperwork for her preadmission testing. Patient is aware of dates, times, and instructions.

## 2013-08-17 ENCOUNTER — Telehealth: Payer: Self-pay | Admitting: *Deleted

## 2013-08-17 NOTE — Telephone Encounter (Signed)
Patient was contacted today to make her aware that she will need to discontinue coumadin for five (5) days prior to surgery per Dr. Bary Castilla. This patient requested that this information be mailed to her. This will go out in the mail tomorrow per her request.   This patient is scheduled for surgery on 09-02-13 at [facility]. She is to come for a pre-op visit with Dr. Bary Castilla on 08-24-13 at 1 pm. Patient to pre-admit on 08-24-13 at 3 pm.  Patient instructed to call the office should she have further questions.

## 2013-08-20 ENCOUNTER — Other Ambulatory Visit: Payer: Self-pay | Admitting: General Surgery

## 2013-08-20 DIAGNOSIS — C50912 Malignant neoplasm of unspecified site of left female breast: Secondary | ICD-10-CM

## 2013-08-20 DIAGNOSIS — Z8679 Personal history of other diseases of the circulatory system: Secondary | ICD-10-CM

## 2013-08-20 DIAGNOSIS — I890 Lymphedema, not elsewhere classified: Secondary | ICD-10-CM

## 2013-08-22 ENCOUNTER — Inpatient Hospital Stay: Payer: Self-pay | Admitting: Internal Medicine

## 2013-08-22 LAB — CBC
HCT: 37.4 % (ref 35.0–47.0)
HGB: 11.9 g/dL — AB (ref 12.0–16.0)
MCH: 34 pg (ref 26.0–34.0)
MCHC: 31.8 g/dL — AB (ref 32.0–36.0)
MCV: 107 fL — AB (ref 80–100)
PLATELETS: 205 10*3/uL (ref 150–440)
RBC: 3.49 10*6/uL — ABNORMAL LOW (ref 3.80–5.20)
RDW: 16.8 % — ABNORMAL HIGH (ref 11.5–14.5)
WBC: 6.1 10*3/uL (ref 3.6–11.0)

## 2013-08-22 LAB — PROTIME-INR
INR: 2.3
PROTHROMBIN TIME: 25 s — AB (ref 11.5–14.7)

## 2013-08-22 LAB — TROPONIN I
TROPONIN-I: 0.36 ng/mL — AB
Troponin-I: 0.37 ng/mL — ABNORMAL HIGH

## 2013-08-22 LAB — URINALYSIS, COMPLETE
Bilirubin,UR: NEGATIVE
Glucose,UR: NEGATIVE mg/dL (ref 0–75)
Hyaline Cast: 6
KETONE: NEGATIVE
LEUKOCYTE ESTERASE: NEGATIVE
Nitrite: NEGATIVE
PH: 8 (ref 4.5–8.0)
RBC,UR: 2654 /HPF (ref 0–5)
Specific Gravity: 1.015 (ref 1.003–1.030)
WBC UR: 24 /HPF (ref 0–5)

## 2013-08-22 LAB — COMPREHENSIVE METABOLIC PANEL
ALBUMIN: 3 g/dL — AB (ref 3.4–5.0)
Alkaline Phosphatase: 88 U/L
Anion Gap: 5 — ABNORMAL LOW (ref 7–16)
BILIRUBIN TOTAL: 0.4 mg/dL (ref 0.2–1.0)
BUN: 19 mg/dL — AB (ref 7–18)
CHLORIDE: 97 mmol/L — AB (ref 98–107)
CO2: 33 mmol/L — AB (ref 21–32)
CREATININE: 0.91 mg/dL (ref 0.60–1.30)
Calcium, Total: 8.8 mg/dL (ref 8.5–10.1)
EGFR (Non-African Amer.): >60
Glucose: 91 mg/dL (ref 65–99)
OSMOLALITY: 272 (ref 275–301)
Potassium: 4.2 mmol/L (ref 3.5–5.1)
SGOT(AST): 16 U/L (ref 15–37)
SGPT (ALT): 15 U/L
Sodium: 135 mmol/L — ABNORMAL LOW (ref 136–145)
Total Protein: 7.3 g/dL (ref 6.4–8.2)

## 2013-08-22 LAB — APTT: ACTIVATED PTT: 37.2 s — AB (ref 23.6–35.9)

## 2013-08-22 LAB — PRO B NATRIURETIC PEPTIDE: B-TYPE NATIURETIC PEPTID: 738 pg/mL — AB (ref 0–125)

## 2013-08-22 LAB — CK-MB
CK-MB: 1.1 ng/mL (ref 0.5–3.6)
CK-MB: 1 ng/mL (ref 0.5–3.6)

## 2013-08-23 ENCOUNTER — Telehealth: Payer: Self-pay

## 2013-08-23 LAB — BASIC METABOLIC PANEL
ANION GAP: 9 (ref 7–16)
BUN: 20 mg/dL — ABNORMAL HIGH (ref 7–18)
CALCIUM: 8.7 mg/dL (ref 8.5–10.1)
CO2: 32 mmol/L (ref 21–32)
CREATININE: 0.91 mg/dL (ref 0.60–1.30)
Chloride: 98 mmol/L (ref 98–107)
EGFR (Non-African Amer.): >60
Glucose: 111 mg/dL — ABNORMAL HIGH (ref 65–99)
OSMOLALITY: 281 (ref 275–301)
Potassium: 4 mmol/L (ref 3.5–5.1)
SODIUM: 139 mmol/L (ref 136–145)

## 2013-08-23 LAB — CBC WITH DIFFERENTIAL/PLATELET
Basophil #: 0 10*3/uL (ref 0.0–0.1)
Basophil %: 0.6 %
EOS PCT: 3.1 %
Eosinophil #: 0.2 10*3/uL (ref 0.0–0.7)
HCT: 36.2 % (ref 35.0–47.0)
HGB: 11.7 g/dL — ABNORMAL LOW (ref 12.0–16.0)
Lymphocyte #: 1 10*3/uL (ref 1.0–3.6)
Lymphocyte %: 19.5 %
MCH: 34.2 pg — ABNORMAL HIGH (ref 26.0–34.0)
MCHC: 32.3 g/dL (ref 32.0–36.0)
MCV: 106 fL — ABNORMAL HIGH (ref 80–100)
MONOS PCT: 15.8 %
Monocyte #: 0.8 x10 3/mm (ref 0.2–0.9)
NEUTROS PCT: 61 %
Neutrophil #: 3.2 10*3/uL (ref 1.4–6.5)
PLATELETS: 198 10*3/uL (ref 150–440)
RBC: 3.42 10*6/uL — AB (ref 3.80–5.20)
RDW: 16.5 % — AB (ref 11.5–14.5)
WBC: 5.2 10*3/uL (ref 3.6–11.0)

## 2013-08-23 LAB — TROPONIN I: Troponin-I: 0.36 ng/mL — ABNORMAL HIGH

## 2013-08-23 LAB — CK-MB: CK-MB: 1.5 ng/mL (ref 0.5–3.6)

## 2013-08-23 LAB — MAGNESIUM: MAGNESIUM: 1.3 mg/dL — AB

## 2013-08-23 LAB — PREGNANCY, URINE: PREGNANCY TEST, URINE: NEGATIVE m[IU]/mL

## 2013-08-23 NOTE — Telephone Encounter (Signed)
Amanda Shah, the patient's niece called to let us know that the patient was in [facility] hospital and that she needed to reschedule her pre op and pre admission appointments. Patient does not want to rescheduled her surgery at this time. She is currently scheduled for surgery on 09/02/13. Patient is rescheduled for a pre operative visit here with Dr Bary Castilla on 08/30/13 at 10:00 am. She will have pre admission testing at the hospital on 08/30/13 at 12 pm. Patient is aware of dates, times, and instructions.

## 2013-08-24 ENCOUNTER — Ambulatory Visit: Payer: Medicaid Other | Admitting: General Surgery

## 2013-08-24 LAB — CBC WITH DIFFERENTIAL/PLATELET
Basophil #: 0 10*3/uL (ref 0.0–0.1)
Basophil %: 0.4 %
EOS ABS: 0.2 10*3/uL (ref 0.0–0.7)
EOS PCT: 2.5 %
HCT: 36.5 % (ref 35.0–47.0)
HGB: 11.5 g/dL — ABNORMAL LOW (ref 12.0–16.0)
LYMPHS ABS: 1.4 10*3/uL (ref 1.0–3.6)
Lymphocyte %: 21.7 %
MCH: 33.8 pg (ref 26.0–34.0)
MCHC: 31.6 g/dL — AB (ref 32.0–36.0)
MCV: 107 fL — AB (ref 80–100)
Monocyte #: 0.8 x10 3/mm (ref 0.2–0.9)
Monocyte %: 13.1 %
NEUTROS ABS: 4 10*3/uL (ref 1.4–6.5)
Neutrophil %: 62.3 %
Platelet: 210 10*3/uL (ref 150–440)
RBC: 3.41 10*6/uL — ABNORMAL LOW (ref 3.80–5.20)
RDW: 17 % — ABNORMAL HIGH (ref 11.5–14.5)
WBC: 6.5 10*3/uL (ref 3.6–11.0)

## 2013-08-24 LAB — BASIC METABOLIC PANEL
Anion Gap: 8 (ref 7–16)
BUN: 21 mg/dL — ABNORMAL HIGH (ref 7–18)
CREATININE: 1.14 mg/dL (ref 0.60–1.30)
Calcium, Total: 9 mg/dL (ref 8.5–10.1)
Chloride: 97 mmol/L — ABNORMAL LOW (ref 98–107)
Co2: 32 mmol/L (ref 21–32)
EGFR (African American): >60
GFR CALC NON AF AMER: 54 — AB
GLUCOSE: 110 mg/dL — AB (ref 65–99)
Osmolality: 277 (ref 275–301)
Potassium: 4.3 mmol/L (ref 3.5–5.1)
Sodium: 137 mmol/L (ref 136–145)

## 2013-08-24 LAB — PROTIME-INR
INR: 1.7
PROTHROMBIN TIME: 19.9 s — AB (ref 11.5–14.7)

## 2013-08-25 ENCOUNTER — Telehealth: Payer: Self-pay

## 2013-08-25 LAB — BASIC METABOLIC PANEL WITH GFR
Anion Gap: 0 — ABNORMAL LOW
BUN: 29 mg/dL — ABNORMAL HIGH
Calcium, Total: 8.7 mg/dL
Chloride: 96 mmol/L — ABNORMAL LOW
Co2: 35 mmol/L — ABNORMAL HIGH
Creatinine: 1.66 mg/dL — ABNORMAL HIGH
EGFR (African American): 40 — ABNORMAL LOW
EGFR (Non-African Amer.): 34 — ABNORMAL LOW
Glucose: 117 mg/dL — ABNORMAL HIGH
Osmolality: 270
Potassium: 5 mmol/L
Sodium: 131 mmol/L — ABNORMAL LOW

## 2013-08-25 LAB — HEMOGLOBIN: HGB: 11.5 g/dL — AB (ref 12.0–16.0)

## 2013-08-25 NOTE — Telephone Encounter (Signed)
Patient's niece, Illa Level, called to let us know that the patient has been released from [facility]. She was in the hospital for pneumonia. She says that the physician who saw her was made aware that she is scheduled for surgery on 09/02/13 and that she would need a clearance due to being seen for pneumonia. Chev[place]a said that they were going to write a letter for this clearing her for surgery. The patient is aware of her appointments here and at pre admission testing at [facility].

## 2013-08-29 ENCOUNTER — Emergency Department: Payer: Self-pay | Admitting: Emergency Medicine

## 2013-08-29 LAB — COMPREHENSIVE METABOLIC PANEL
ALBUMIN: 2.9 g/dL — AB (ref 3.4–5.0)
ALT: 14 U/L
AST: 20 U/L (ref 15–37)
Alkaline Phosphatase: 82 U/L
Anion Gap: 6 — ABNORMAL LOW (ref 7–16)
BILIRUBIN TOTAL: 0.2 mg/dL (ref 0.2–1.0)
BUN: 32 mg/dL — ABNORMAL HIGH (ref 7–18)
CREATININE: 1.18 mg/dL (ref 0.60–1.30)
Calcium, Total: 9.1 mg/dL (ref 8.5–10.1)
Chloride: 98 mmol/L (ref 98–107)
Co2: 32 mmol/L (ref 21–32)
EGFR (African American): 60 — ABNORMAL LOW
GFR CALC NON AF AMER: 52 — AB
Glucose: 107 mg/dL — ABNORMAL HIGH (ref 65–99)
Osmolality: 279 (ref 275–301)
POTASSIUM: 4.8 mmol/L (ref 3.5–5.1)
SODIUM: 136 mmol/L (ref 136–145)
TOTAL PROTEIN: 7.2 g/dL (ref 6.4–8.2)

## 2013-08-29 LAB — CBC
HCT: 32.1 % — AB (ref 35.0–47.0)
HGB: 10.1 g/dL — AB (ref 12.0–16.0)
MCH: 33.5 pg (ref 26.0–34.0)
MCHC: 31.6 g/dL — ABNORMAL LOW (ref 32.0–36.0)
MCV: 106 fL — ABNORMAL HIGH (ref 80–100)
Platelet: 201 10*3/uL (ref 150–440)
RBC: 3.02 10*6/uL — ABNORMAL LOW (ref 3.80–5.20)
RDW: 17 % — ABNORMAL HIGH (ref 11.5–14.5)
WBC: 6.5 10*3/uL (ref 3.6–11.0)

## 2013-08-29 LAB — WET PREP, GENITAL

## 2013-08-29 LAB — HCG, QUANTITATIVE, PREGNANCY: Beta Hcg, Quant.: <1 m[IU]/mL — ABNORMAL LOW

## 2013-08-29 LAB — LIPASE, BLOOD: LIPASE: 60 U/L — AB (ref 73–393)

## 2013-08-30 ENCOUNTER — Telehealth: Payer: Self-pay

## 2013-08-30 ENCOUNTER — Ambulatory Visit: Payer: Medicaid Other | Admitting: General Surgery

## 2013-08-30 NOTE — Telephone Encounter (Signed)
Patient called to let us know that she was seen again yesterday in [facility] ER for vaginal bleeding and that she is still bleeding heavily. She is scheduled to see Dr Hassell Done Defrancesco on 09/01/13 at 1:00 pm. I let her know that we would cancel her appointment here with [place] as well as her pre admission testing at the hospital today. I also let her know that we would be cancelling her surgery that was scheduled for 09/02/13 until she able to find out what the reason for her bleeding is and can get medically cleared. The patient expressed her appreciation for everything and would call back to keep [place] apprised of the situation.

## 2013-09-08 ENCOUNTER — Telehealth: Payer: Self-pay | Admitting: *Deleted

## 2013-09-08 NOTE — Telephone Encounter (Signed)
Patient called the office wanting to reschedule surgery.   This patient's surgery has been scheduled for 09-23-13 at [facility]. She will come to the office for a pre-op visit on 09-15-13. Paperwork has been mailed to the patient.  Patient will need to discontinue Plavix five days prior to surgery.

## 2013-09-15 ENCOUNTER — Ambulatory Visit: Payer: Medicaid Other | Admitting: General Surgery

## 2013-09-15 ENCOUNTER — Telehealth: Payer: Self-pay | Admitting: *Deleted

## 2013-09-15 NOTE — Telephone Encounter (Signed)
Patient called the office today to reschedule pre-op visit and pre-admit appointment that was scheduled for 09-15-13. This has been moved to 09-21-13 because patient states she cannot make appointments today.   Patient's surgery is currently scheduled for 09-23-13 at [facility].

## 2013-09-20 ENCOUNTER — Telehealth: Payer: Self-pay | Admitting: *Deleted

## 2013-09-20 NOTE — Telephone Encounter (Signed)
Pts niece called and wanted to cancel surgery and also pre-op due to pt passing away.

## 2013-09-21 ENCOUNTER — Ambulatory Visit: Payer: Medicaid Other | Admitting: General Surgery

## 2013-10-03 ENCOUNTER — Ambulatory Visit: Payer: Medicaid Other | Admitting: General Surgery

## 2013-10-13 DEATH — deceased

## 2013-11-14 ENCOUNTER — Encounter: Payer: Self-pay | Admitting: General Surgery

## 2014-05-06 NOTE — Discharge Summary (Signed)
PATIENT NAME:  Amanda Shah, Amanda Shah MR#:  [id number] DATE OF BIRTH:  1955-03-17  DATE OF ADMISSION:  08/22/2013 DATE OF DISCHARGE:  08/25/2013  ADMISSION DIAGNOSES: 1.  Acute-on-chronic systolic heart failure.  2.  Vaginal bleeding.  DISCHARGE DIAGNOSES: 1.  Acute-on-chronic systolic heart failure with severe dilated cardiomyopathy.  2.  Vaginal bleeding with thickened endometrial lining.  3.  Left lower lobe pneumonia. 4.  Elevated troponin due to demand ischemia.  5.  Atrial fibrillation.  6.  History of breast cancer.  7.  Hypotension.   CONSULTATIONS: Cardiology with Amanda Skains, MD  IMAGING: 1.  The patient had an echocardiogram which showed an EF of less than 20%, moderately dilated left atrium and right atrium, mild-to-moderate mitral valve regurgitation.  2.  Pelvic ultrasound showed a thickened endometrium.   HOSPITAL COURSE: This is a 59 year old female with a past medical history of hypertension, diastolic heart failure and systolic heart failure, and breast cancer who presented with CHF exacerbation and vaginal bleeding.  For further details, please refer to H and P.   ASSESSMENT AND PLAN:  1.  Acute-on-chronic systolic heart failure. Her echo on this admission showed an EF of less than 20% with severely dilated cardiomyopathy. The patient was placed on IV Lasix. She was probably over-diuresed as her blood pressure was trending down and her creatinine went up a little, so we gave her a little bit of fluid back. Her blood pressure remained stable. Cardiology consult was appreciated. No further diagnostics at this time. From her systolic heart failure standpoint of view, she is improved. She was on oxygen at home previously and will require some oxygen at discharge.  2.  Left lower lobe pneumonia, on Levaquin.  3.  Elevated troponins due to demand ischemia, NSTEMI was ruled out. 4.  Vaginal bleeding. The patient has been on Coumadin and aspirin. These have been stopped, but she  will need an endometrial biopsy, and OB/GYN did see the patient in consultation and will have followup with outpatient in 1 week for the endometrial biopsy. 5.  Atrial fibrillation. Her rate is controlled.  Her aspirin and Coumadin are on hold for now due to the vaginal bleeding. 6.  History of breast cancer. The patient follows up with Amanda Shah. 7.  Hypotension and acute kidney injury, likely from overdiuresis, which has improved.   DISCHARGE MEDICATIONS:  1.  Loratadine 10 mg daily. 2.  Spironolactone 50 mg daily.  3.  Coreg 6.25 b.i.d.  4.  Lasix 80 mg b.i.d.  5.  Tramadol 50 mg q. 12 hours p.r.n. pain.  6.  Lisinopril 10 mg daily.  7.  KCl 10 mg 2 tablets b.i.d.  8.  Magnesium oxide 400 mg 2 tablets b.i.d.  9.  One-A-Day multivitamin tablet daily.  10.  Allopurinol 100 mg daily.  11.  Promethazine 25 mg q. 6 hours p.r.n.  12.  Gabapentin 100 mg p.o. b.i.d.  13.  Acetaminophen 325/hydrocodone 10 q. 6 hours.  14.  ProAir 2 puffs 4 times a day.  15.  Levaquin 500 mg q. 24 hours x 5 days.  DISCONTINUED AND HELD MEDICATIONS:  The patient will stop taking aspirin and Coumadin. She is actually going to hold her blood pressure medications and resume them on Saturday.   TIME SPENT: Approximately 35 minutes.  DISPOSITION: The patient was stable for discharge.    ____________________________ Amanda Lingerfelt P. Benjie Karvonen, MD spm:DT D: 08/25/2013 12:48:00 ET T: 08/25/2013 14:04:51 ET JOB#: [id number]  cc: Amanda Reily P. Benjie Karvonen, MD, <Dictator>  Amanda Skains, MD Amanda Eland P Malakhi Markwood MD ELECTRONICALLY SIGNED 08/26/2013 14:04

## 2014-05-06 NOTE — H&P (Signed)
PATIENT NAME:  Amanda Shah, Amanda Shah MR#:  [id number] DATE OF BIRTH:  29-Oct-1955  DATE OF ADMISSION:  08/22/2013  PRIMARY CARE PHYSICIAN: Dr. Linton Rump  REFERRING PHYSICIAN: Edd Fabian.   CHIEF COMPLAINT: Vaginal bleeding for 3 days, cough, shortness of breath, sputum for 1 month.   HISTORY OF PRESENT ILLNESS: A 59 year old African American female with a history of hypertension, CHF, atrial fibrillation on Coumadin, who presented to the ED with vaginal bleeding for 3 days. The patient is alert, awake, oriented. The patient is postmenopausal for 5-6 years, got new vaginal bleeding for the past 3 days. In addition, the patient has a cough, shortness of breath, sputum for the past 1 month which has been worsening for 1 week. The patient denies any fever or chills. No chest pain, palpitation, orthopnea, or nocturnal dyspnea, but has leg edema and weight gain. The patient got a pelvis ultrasound which showed endometriosis. Dr. Edd Fabian contacted with a GYN physician, who suggested get a biopsy as outpatient. However, the patient's chest x-ray showed left-side opacity, possible pneumonia, so Dr. Edd Fabian started IV antibiotics and admitted the patient for pneumonia. In addition, the patient has elevated troponin, 0.37. Dr. Edd Fabian started aspirin in the ED.   PAST MEDICAL HISTORY: Hypertension, CHF, atrial fibrillation on Coumadin, breast cancer status post chemotherapy.   PAST SURGICAL HISTORY: No surgical history.   SOCIAL HISTORY: No smoking, alcohol drinking, or illicit drugs.   FAMILY HISTORY: Hypertension, diabetes, but no heart attack or stroke.   ALLERGIES: No.  HOME MEDICATIONS: 1.  Warfarin 6 mg p.o. every day.  2.  Warfarin 1 mg p.o. 2 tablets on Monday, Wednesday, Friday.  3.  Tramadol 50 mg p.o. every 12 hours p.r.n. 4.  Spironolactone 50 mg p.o. daily.  5.  Promethazine 25 mg every 6 hours p.r.n. 6.  ProAir HFA CFC-free 90 mcg inhalation 2 puffs 4 times a day.  7.  Potassium chloride 10 mEq 2 tablets  b.i.d. 8.  One-A-Day Women's multivitamin with mineral tablets 1 tablet a day.  9.  Magnesium oxide 400 mg p.o. b.i.d. 10.  Loratadine 10 mg p.o. once a day. 11.  Lisinopril 10 mg p.o. in the morning.  12.  Gabapentin 100 mg p.o. b.i.d. 13.  Lasix 80 mg p.o. b.i.d. 14.  Coreg 6.25 mg p.o. b.i.d. 15.  Aspirin 81 mg p.o. daily. 16.  Allopurinol 100 mg p.o. daily.  REVIEW OF SYSTEMS:  CONSTITUTIONAL: The patient denies any fever or chills. No headache, but has dizziness and weakness.  EYES: No double vision or blurry vision.  ENT: No postnasal drip, slurred speech, or dysphagia.  CARDIOVASCULAR: No chest pain, palpitation, orthopnea, nocturnal dyspnea, but has leg edema.  PULMONARY: Positive for cough, sputum, shortness of breath. No wheezing, no hemoptysis.  GASTROINTESTINAL: No abdominal pain, nausea, vomiting, diarrhea. No melena or bloody stool. GENITOURINARY: No dysuria, hematuria, or incontinence.  OBSTETRIC AND GYNECOLOGIC: Has vaginal bleeding.  NEUROLOGY: No syncope, loss of consciousness, or seizure.  ENDOCRINOLOGY: No polyuria, polydipsia, heat or cold intolerance.  SKIN: No rash or jaundice.  HEMATOLOGY: No easy bruising or bleeding but has vaginal bleeding.   PHYSICAL EXAMINATION:  VITAL SIGNS: Temperature 98.2, blood pressure 114/77, pulse 89, oxygen saturation 100% on oxygen.  GENERAL: The patient is alert, awake, oriented, in no acute distress.  HEENT: Pupils round, equal, reactive to light and accommodation. Moist oral mucosa. Clear oropharynx.  NECK: Supple. No JVD or carotid bruits. No lymphadenopathy. No thyromegaly.  CARDIOVASCULAR: S1, S2 regular rate, rhythm. No murmurs or  gallops.  PULMONARY: Bilateral air entry. No wheezing or rales but had some mild crackles. No use of accessory muscles to breathe.  ABDOMEN: Soft, obese. No distention or tenderness. No organomegaly. Bowel sounds present.  EXTREMITIES: Bilateral leg edema. No clubbing or cyanosis. No calf  tenderness. It is difficult to estimate whether the patient has pedal pulses due to morbid obesity.  SKIN: No rash or jaundice.  NEUROLOGY: A and O x 3. No focal deficit. Power 5/5. Sensation intact.   LABORATORY DATA: 1.  Pelvis ultrasound showed homogeneously thickened endothelium measuring 10 mm. Suggest a focal lesion workup. 2.  Chest x-ray: Increase in left lung base opacity, which could reflect pneumonia or atelectasis. Stable cardiomegaly with mild increased pulmonary vascular congestion.  3.  INR 2.3. 4.  Urinalysis shows RBC 2654, WBC 24, nitrite negative. Urinalysis is possibly due to contamination from vaginal bleeding, need a repeat. 5.  CBC showed WBC 6.1, hemoglobin 11.9, platelets 205,000. 6.  Glucose 91, BUN 19, creatinine 0.91, sodium 135, potassium 4.2, chloride 97, bicarb 33. BNP 738. Troponin 0.37. CK-MB 1.1. 7.  EKG showed normal sinus rhythm at 86 BPM with left atrial enlargement.   IMPRESSIONS: 1.  Pneumonia.  2.  Non-ST-elevation myocardial infarction. 3.  Vaginal bleeding.  4.  History of congestive heart failure and atrial fibrillation.  5.  Hypertension.  6.  Morbid obesity.  7.  Breast cancer.   PLAN OF TREATMENT:  1.  The patient will be admitted to telemetry floor. The patient was treated with aspirin 325 mg 1 dose in ED. We will start statin but hold the Coumadin due to vaginal bleeding. Follow up echocardiograph and cardiologist consult. Depending on cardiologist consult, may resume Coumadin and continue aspirin.  2.  For pneumonia, we will start Levaquin, follow her blood culture, sputum culture. 3.  For history of CHF, continue Lasix. 4.  Also follow up her troponin level.  5.  I discussed the patient's condition and plan of treatment with the patient. The patient wants full code.   TIME SPENT: About 67 minutes.    ____________________________ Demetrios Loll, MD qc:sk D: 08/22/2013 21:17:14 ET T: 08/22/2013 21:47:24 ET JOB#: [id number]  cc: Demetrios Loll,  MD, <Dictator> Demetrios Loll MD ELECTRONICALLY SIGNED 08/25/2013 21:56

## 2014-05-06 NOTE — Consult Note (Signed)
PATIENT NAME:  Amanda Shah, Amanda Shah MR#:  [id number] DATE OF BIRTH:  10-24-1955  DATE OF CONSULTATION:  08/23/2013  REFERRING PHYSICIAN:  Dr Bridgett Larsson CONSULTING PHYSICIAN:  Corey Skains, MD  REASON FOR CONSULTATION: Atrial fibrillation with controlled ventricular rate heart failure, elevated troponin with bleeding complications.   CHIEF COMPLAINT: "I'm short of breath."   HISTORY OF PRESENT ILLNESS: This is a 59 year old female with known severe dilated cardiomyopathy with ejection fraction in the 20% to 30% range, on appropriate medication management including beta blocker, ACE inhibitor, and Aldactone. She also has had atrial fibrillation with controlled ventricular rate with an EKG today showing atrial fibrillation with controlled ventricular rate. She has had somewhat new onset of acute on chronic systolic dysfunction heart failure with a BNP of 738 and a troponin of 0.37, most consistent with possible hypoxia and bleeding. The patient does have some vaginal bleeding likely secondary to Coumadin and previous possible cancer versus vaginal bleeding. The patient has had some improvements of this with her hemoglobin stable at this time. There is no evidence of chest discomfort at this time.   REVIEW OF SYSTEMS: The remainder of review is negative for vision change, ringing in the ears, hearing loss, cough, congestion, heartburn, nausea, vomiting, diarrhea, bloody stools, syncope, dizziness, headache, blackouts, frequent urination, urination at night, muscle weakness, numbness, anxiety, depression, skin lesions, skin rash.   PAST MEDICAL HISTORY: 1.  Atrial fibrillation.  2.  Severe dilated cardiomyopathy.  3.  Breast cancer.  4.  Hypertension.   FAMILY HISTORY: Multiple family members with early onset of cardiovascular disease and some cancer.   SOCIAL HISTORY: She currently denies alcohol or tobacco use.   ALLERGIES: As listed.   MEDICATIONS: As listed.   PHYSICAL EXAMINATION: VITAL SIGNS:  Blood pressure is 110/68 bilaterally and heart rate is 78 upright and reclining and slightly irregular.  GENERAL: She is a large-appearing female in no acute chest.  HEENT: No icterus, thyromegaly, ulcers, hemorrhage, or xanthelasma.  CARDIOVASCULAR: Irregularly irregular with normal S1 and S2. Distant heart sounds. No apparent murmur, gallop, or rub. PMI is diffuse. Carotid upstroke normal with no assessment of jugular venous pressure due to increased neck size.  LUNGS: Clear to auscultation with normal respirations.  ABDOMEN: Soft and nontender but cannot assess hepatosplenomegaly or masses.  EXTREMITIES: 2+ radial, no dorsal pedal pulses or femoral pulses, with 1+ lower extremity edema. No apparent sinus, clubbing or ulcers.  NEUROLOGIC: She is oriented to time, place and person with normal mood and affect.   ASSESSMENT: A 59 year old female with severe dilated cardiomyopathy with mild acute on chronic systolic dysfunction congestive heart failure, atrial fibrillation with controlled ventricular rate and elevated troponin consistent with demand ischemia with vaginal bleeding and anemia needing further treatment.   RECOMMENDATIONS: 1.  Discontinuation of Coumadin due to concerns of vaginal bleeding until further evaluation of causes of bleeding.  2.  Continue current beta blocker for heart rate control of atrial fibrillation.  3.  Other medication management, ACE inhibitor treatment, for congestive heart failure with Lasix as needed for any pulmonary edema.  4.  No further intervention of elevated troponin consistent with demand ischemia.  5.  Further evaluation by echocardiogram for left ventricular systolic dysfunction and valvular heart disease contributing to above.  6.  Further ambulation and further treatment options after above.  ____________________________ Corey Skains, MD bjk:sb D: 08/23/2013 11:01:33 ET T: 08/23/2013 11:58:19 ET JOB#: [id number]  cc: Corey Skains, MD,  <Dictator> Corey Skains MD ELECTRONICALLY  SIGNED 08/24/2013 10:19

## 2014-05-06 NOTE — Op Note (Signed)
PATIENT NAME:  Amanda Shah, Amanda Shah MR#:  [id number] DATE OF BIRTH:  [birth year]  DATE OF PROCEDURE:  02/04/2013  PREOPERATIVE DIAGNOSES:  1.  Advanced left breast cancer, need for central venous access.  2.  Morbid obesity.   POSTOPERATIVE DIAGNOSES: 1.  Advanced left breast cancer, need for central venous access.  2.  Morbid obesity.   OPERATIVE PROCEDURE: Placement of central venous access port.   SURGEON: Hervey Ard, MD.  ANESTHESIA: 1% Xylocaine plain, 26 mL, Versed 2 mg IV.   CLINICAL NOTE: This 59 year old woman was recently identified with a large left breast mass and is felt to be a candidate for neoadjuvant chemotherapy. Central venous access was requested by her treating physician. The patient had no venous access.   DESCRIPTION OF PROCEDURE: Under ultrasound guidance, a left forearm vein IV was placed. While there was good blood return and initial flow, by the time the patient was transported from the holding area to the operating room, the vein had become nonfunctional and contrast study using one half strength Conray 60 showed extravasation. It could not be repositioned. The patient had already had at least 6 attempts by multiple team members and it was elected to proceed under local anesthesia alone. The patient was placed supine on the operating table, but her saturations fell significantly. When she was elevated to 30 degrees head up, the internal jugular vein collapsed with each respiration precluding its use as an access site. It was not felt possible to complete a percutaneous stick of the subclavian vein due to the chest wall thickness. A cephalic vein cutdown in the deltoid groove was undertaken after ultrasound confirmed patency of the vein and inability to directly access it with a micropuncture technique. Local anesthesia was instilled, 1% plain Xylocaine and a 4 cm incision made and carried down through a 4 cm depth of adipose tissue. The vein was looped distally with  3-0  silk and micropuncture needle used to cannulate the vein followed by passage of the guidewire and initial dilator. This was then swapped for the PowerPort guidewire and dilator system. The initial passage of the wire went easily into the heart, but when the dilator was passed there was difficulty in moving the catheter past the tip of the dilator system. The dilator was removed, the guidewire e-advanced under fluoroscopic guidance and the catheter directed into the SVC. This was confirmed by contrast injection, again one half strength Conray 60. Approximately 15 mL total volume used during the procedure. After assuring intravenous location and the catheter tip near the junction of the SVC and right atrium, the vein entry site was tied with 3-0 Vicryl suture. There was no good place to put the port where it would be firmly palpable due to the adipose tissue in the area. A position just above the vein access site where it could be pushed against the head of the clavicle and the olecranon process was chosen. A pocket was made and the port attached to the catheter. This easily irrigated and aspirated. The port was anchored in place with 3-0 Prolene sutures x 2. The wound was closed in layers with 3-0 Vicryl to the adipose tissue and a running 4-0 Vicryl subcuticular suture for the skin. Erect chest x-ray obtained in the recovery room showed the catheter tip as described above and no evidence of pneumothorax.   Of note, the patient was given Kefzol 2 grams intravenously once IV access was obtained through the port and it was through this same access site  that 2 mL of Versed were administered to make the patient more comfortable.   All in all,  the patient tolerated the procedure well and was taken to the day surgery area in stable condition.   ____________________________ Robert Bellow, MD jwb:aw D: 02/04/2013 12:25:36 ET T: 02/04/2013 12:37:41 ET JOB#: [id number]  cc: Robert Bellow, MD,  <Dictator> Kathlene November. Grayland Ormond, MD Ferrell Flam Amedeo Kinsman MD ELECTRONICALLY SIGNED 02/05/2013 5:57

## 2015-02-15 IMAGING — NM NM  CARDIAC MUGA REST SCAN 2 0F 2
5 series · 32 of 32 positions shown · non-contrast
Comparison: CT CHEST-ABD-PELV W/ CM dated 01/21/2013

RADIOPHARMACEUTICALS:  [id number] in-vitro labeled red blood
cells.

CLINICAL DATA: Breast cancer.  Pre chemotherapy cardiac

EXAM:
NUCLEAR MEDICINE CARDIAC BLOOD POOL IMAGING (MUGA)
TECHNIQUE: Cardiac multi-gated acquisition was performed at rest following
intravenous injection of [id number] labeled red blood cells.

[Series 1000: 45 lao-gated (results) · 3.30mm/px · 6 of 24 frames shown]
[frame 3/24  full-range]
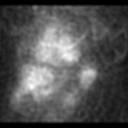
[frame 7/24  full-range]
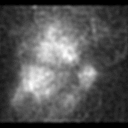
[frame 11/24  full-range]
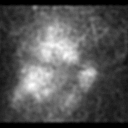
[frame 15/24  full-range]
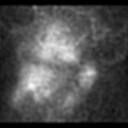
[frame 19/24  full-range]
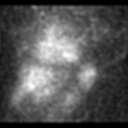
[frame 23/24  full-range]
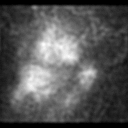

[Series 1000: 70 degree-gated · 3.30mm/px · 6 of 24 frames shown]
[frame 3/24  full-range]
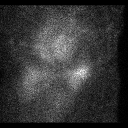
[frame 7/24  full-range]
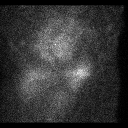
[frame 11/24  full-range]
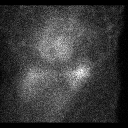
[frame 15/24  full-range]
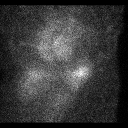
[frame 19/24  full-range]
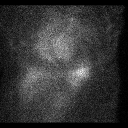
[frame 23/24  full-range]
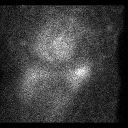

[Series 1000: 45 lao-gated · 3.30mm/px · 6 of 24 frames shown]
[frame 3/24  full-range]
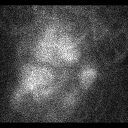
[frame 7/24  full-range]
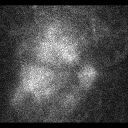
[frame 11/24  full-range]
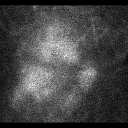
[frame 15/24  full-range]
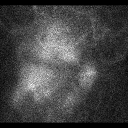
[frame 19/24  full-range]
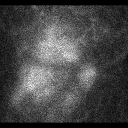
[frame 23/24  full-range]
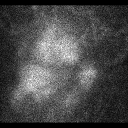

[Series 1000: 45 lao-gated (original with roi) · 3.30mm/px · 6 of 24 frames shown]
[frame 3/24  full-range]
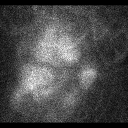
[frame 7/24  full-range]
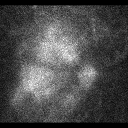
[frame 11/24  full-range]
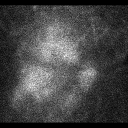
[frame 15/24  full-range]
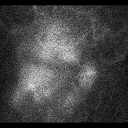
[frame 19/24  full-range]
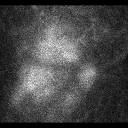
[frame 23/24  full-range]
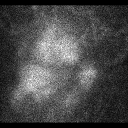

[Series 1000: 45 lao-gated (functional) · 3.30mm/px · 8 of 8 slices shown]
[im 1/8  full-range]
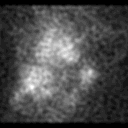
[im 2/8  full-range]
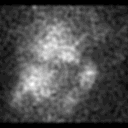
[im 3/8]
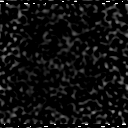
[im 4/8  full-range]
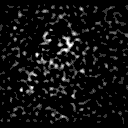
[im 5/8  full-range]
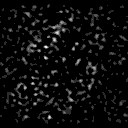
[im 6/8  full-range]
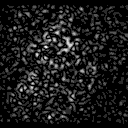
[im 7/8]
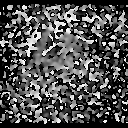
[im 8/8]
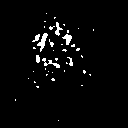

[32 of 32 positions shown; findings below may reference images not displayed]

FINDINGS: There is some image degradation due to patient body habitus. The
left ventricle appears enlarged and there is global hypokinesia.

Calculated left ejection fraction equals 33%
IMPRESSION: 1. Left ventricular hypertrophy and hypokinesia.
2. Left ventricle ejection fraction equals 33%

## 2015-09-16 IMAGING — CR DG CHEST 1V PORT
1 series · 2 of 2 positions shown · non-contrast
Comparison: 08/22/2013.

CLINICAL DATA: Shortness of breath.

EXAM:
PORTABLE CHEST - 1 VIEW

[Series 1: ap · 0.17mm/px · 2 of 2 slices shown]
[im 1/2]
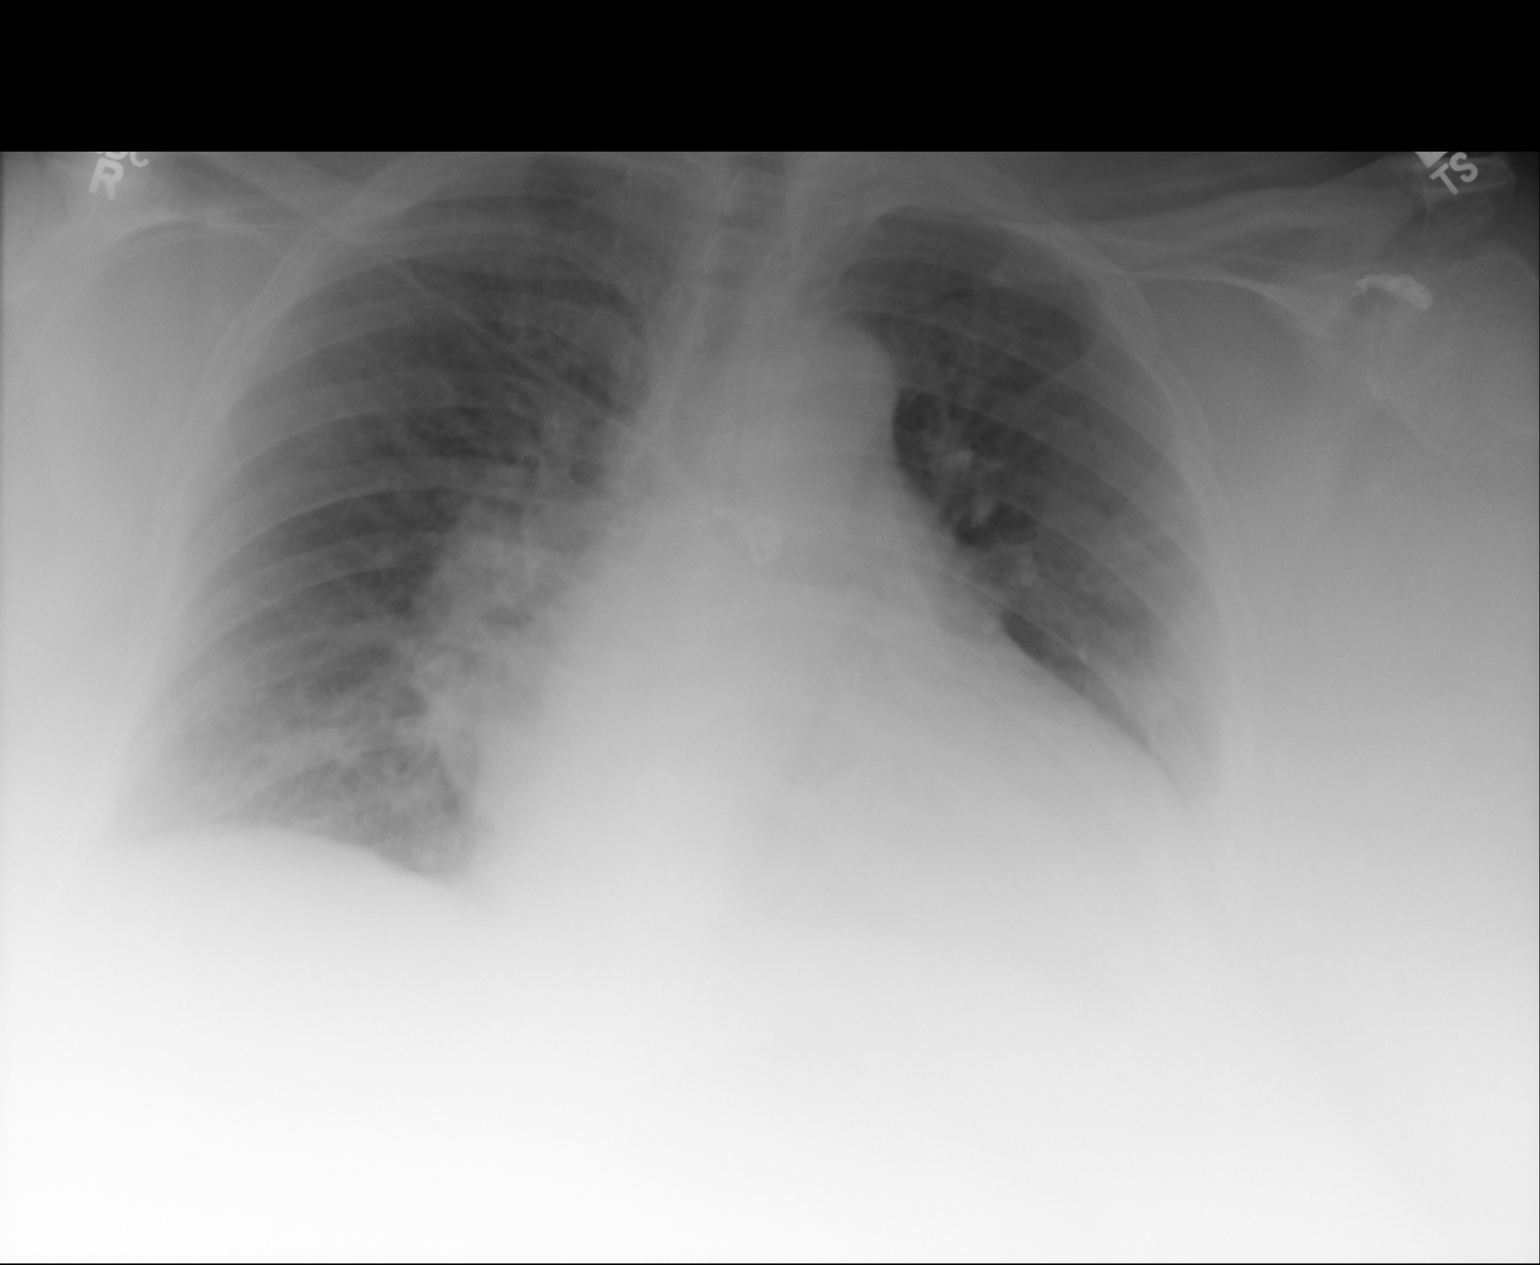
[im 2/2]
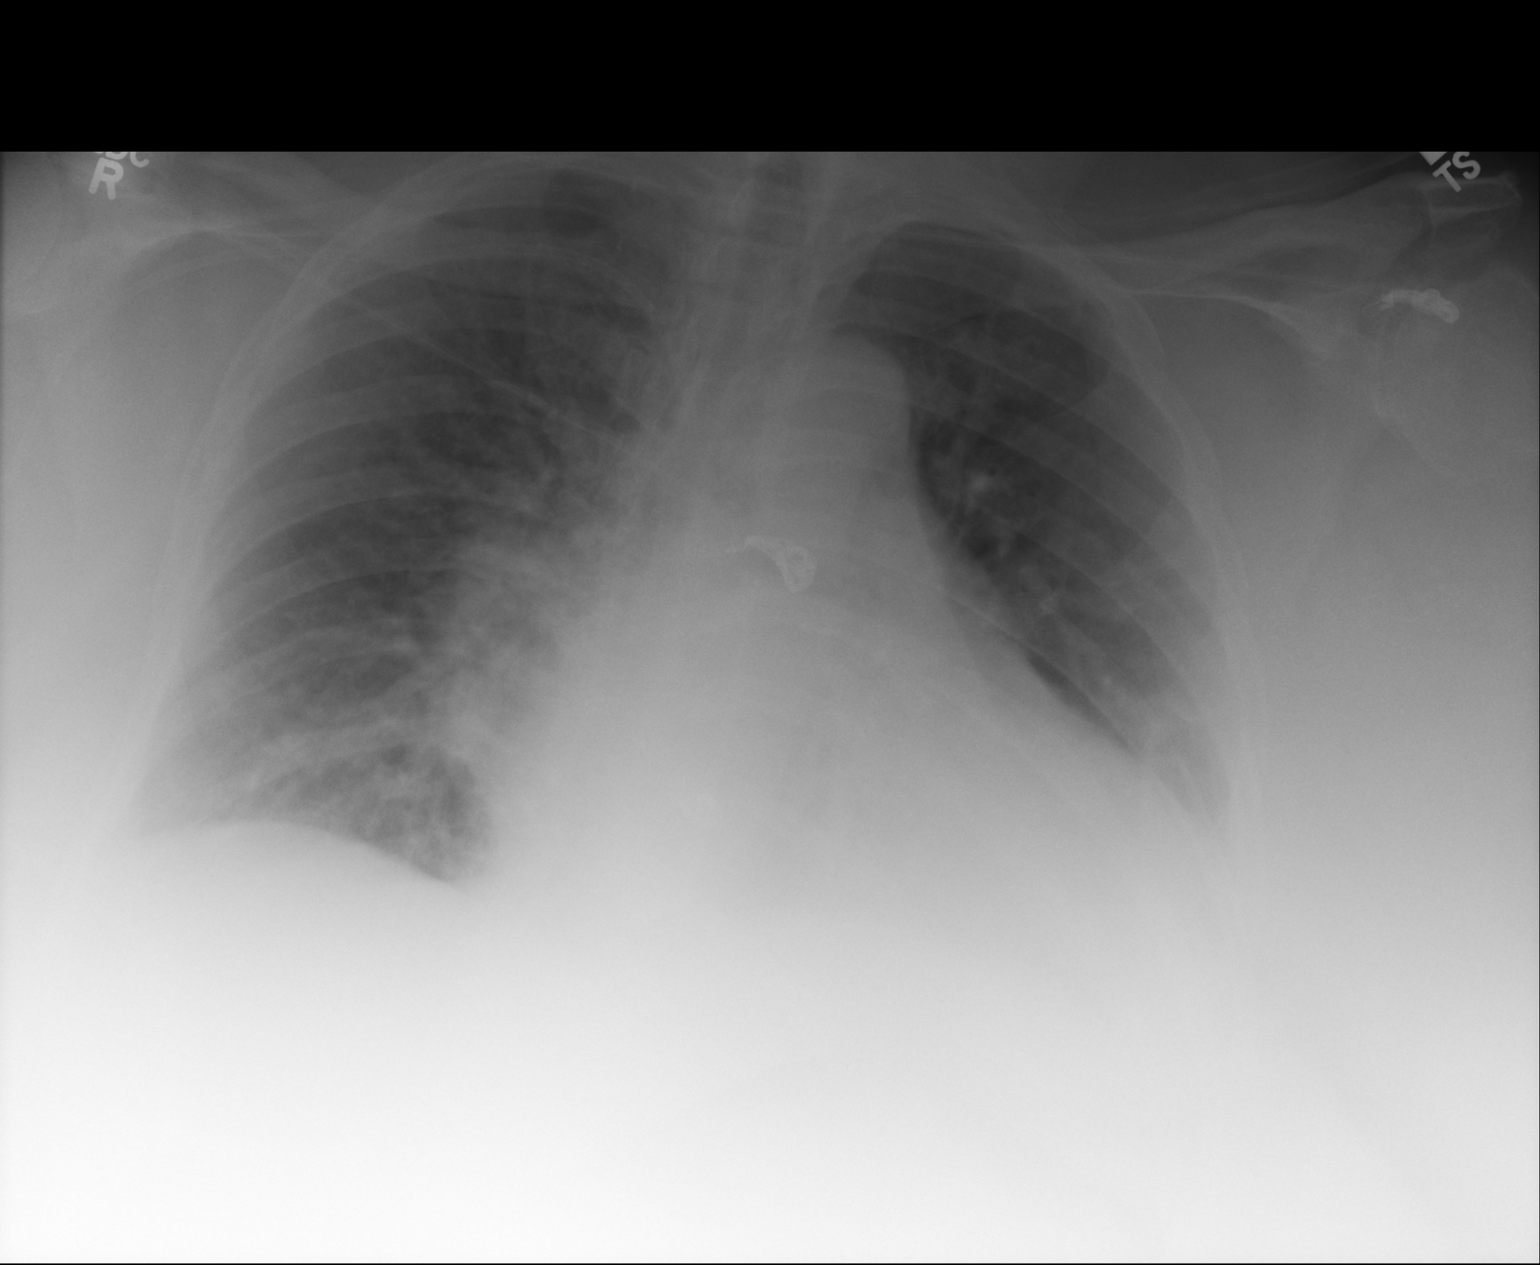

[2 of 2 positions shown; findings below may reference images not displayed]

FINDINGS: The heart is enlarged but stable. Prominent hilar contours are
unchanged. The right-sided central venous catheter is unchanged.
Persistent vascular congestion but overall improved lung aeration.
No overt pulmonary edema. No definite pleural effusions.
IMPRESSION: Persistent cardiac enlargement and central vascular congestion but
overall slight improved aeration since prior chest x-ray.
